# Patient Record
Sex: Male | Born: 1965 | Race: White | Hispanic: No | Marital: Married | Smoking: Never smoker
Health system: Southern US, Community
[De-identification: ages and names within clinical notes are randomized; demographics above are authoritative.]

## PROBLEM LIST (undated history)

## (undated) DIAGNOSIS — N200 Calculus of kidney: Secondary | ICD-10-CM

## (undated) DIAGNOSIS — K8020 Calculus of gallbladder without cholecystitis without obstruction: Secondary | ICD-10-CM

## (undated) HISTORY — PX: LITHOTRIPSY: SUR834

---

## 2012-03-24 ENCOUNTER — Emergency Department (HOSPITAL_BASED_OUTPATIENT_CLINIC_OR_DEPARTMENT_OTHER)
Admission: EM | Admit: 2012-03-24 | Discharge: 2012-03-24 | Disposition: A | Discharge status: Home / Self Care | Payer: Self-pay | Attending: Emergency Medicine | Admitting: Emergency Medicine

## 2012-03-24 ENCOUNTER — Encounter (HOSPITAL_BASED_OUTPATIENT_CLINIC_OR_DEPARTMENT_OTHER): Payer: Self-pay | Admitting: *Deleted

## 2012-03-24 ENCOUNTER — Emergency Department (HOSPITAL_BASED_OUTPATIENT_CLINIC_OR_DEPARTMENT_OTHER): Payer: Self-pay

## 2012-03-24 DIAGNOSIS — K8020 Calculus of gallbladder without cholecystitis without obstruction: Secondary | ICD-10-CM | POA: Insufficient documentation

## 2012-03-24 DIAGNOSIS — N200 Calculus of kidney: Secondary | ICD-10-CM | POA: Insufficient documentation

## 2012-03-24 DIAGNOSIS — R109 Unspecified abdominal pain: Secondary | ICD-10-CM | POA: Insufficient documentation

## 2012-03-24 DIAGNOSIS — K5730 Diverticulosis of large intestine without perforation or abscess without bleeding: Secondary | ICD-10-CM | POA: Insufficient documentation

## 2012-03-24 HISTORY — DX: Calculus of kidney: N20.0

## 2012-03-24 HISTORY — DX: Calculus of gallbladder without cholecystitis without obstruction: K80.20

## 2012-03-24 LAB — BASIC METABOLIC PANEL
BUN: 17 mg/dL (ref 6–23)
Calcium: 9.4 mg/dL (ref 8.4–10.5)
Creatinine, Ser: 1.1 mg/dL (ref 0.50–1.35)
GFR calc Af Amer: >90 mL/min (ref >90)
GFR calc non Af Amer: 78 mL/min — ABNORMAL LOW (ref >90)
Glucose, Bld: 107 mg/dL — ABNORMAL HIGH (ref 70–99)
Potassium: 3.9 mEq/L (ref 3.5–5.1)

## 2012-03-24 LAB — LIPASE, BLOOD: Lipase: 56 U/L (ref 11–59)

## 2012-03-24 LAB — URINALYSIS, ROUTINE W REFLEX MICROSCOPIC
Bilirubin Urine: NEGATIVE
Ketones, ur: NEGATIVE mg/dL
Nitrite: NEGATIVE
Protein, ur: NEGATIVE mg/dL
Urobilinogen, UA: 0.2 mg/dL (ref 0.0–1.0)

## 2012-03-24 LAB — CBC
Hemoglobin: 16.1 g/dL (ref 13.0–17.0)
MCH: 31 pg (ref 26.0–34.0)
MCHC: 35.6 g/dL (ref 30.0–36.0)
RDW: 11.5 % (ref 11.5–15.5)

## 2012-03-24 LAB — URINE MICROSCOPIC-ADD ON

## 2012-03-24 LAB — HEPATIC FUNCTION PANEL
Alkaline Phosphatase: 64 U/L (ref 39–117)
Indirect Bilirubin: 1.3 mg/dL — ABNORMAL HIGH (ref 0.3–0.9)
Total Bilirubin: 1.5 mg/dL — ABNORMAL HIGH (ref 0.3–1.2)
Total Protein: 7.4 g/dL (ref 6.0–8.3)

## 2012-03-24 MED ORDER — SODIUM CHLORIDE 0.9 % IV BOLUS (SEPSIS)
1000.0000 mL | Freq: Once | INTRAVENOUS | Status: AC
  Administered 2012-03-24: 1000 mL via INTRAVENOUS

## 2012-03-24 MED ORDER — TAMSULOSIN HCL 0.4 MG PO CAPS: 0.4000 mg | ORAL_CAPSULE | Freq: Every day | ORAL | Status: AC

## 2012-03-24 MED ORDER — OXYCODONE-ACETAMINOPHEN 5-325 MG PO TABS: 1.0000 | ORAL_TABLET | ORAL | Status: AC | PRN

## 2012-03-24 NOTE — ED Provider Notes (Signed)
History     CSN: [id number]  Arrival date & time 03/24/12  1739   First MD Initiated Contact with Patient 03/24/12 1758      Chief Complaint  Patient presents with  . Abdominal Pain  . Emesis    (Consider location/radiation/quality/duration/timing/severity/associated sxs/prior treatment) HPI CC: Abdominal pain  Abdominal pain: Acute onset, on Saturday night after a church cook out. No other patrons became ill. Started out as a crampy pain . Sunday cramps continued and pt became light headed. Last night pt dryheaved x1. Took laxatives thinking it may be related to constipation but had multiple BM w/o relief. Pain relieved w/ rest. Worsened by movement and standing/pressure on belly. PO normal. Denies recent sick contacts, recent travel or animal bites. Denies fever hematochezia, hematemesis, dysuria, frequency.   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   History reviewed. No pertinent past medical history.  History reviewed. No pertinent past surgical history.  History reviewed. No pertinent family history.  History  Substance Use Topics  . Smoking status: Not on file  . Smokeless tobacco: Not on file  . Alcohol Use: Not on file      Review of Systems Per hpi Allergies  Review of patient's allergies indicates no known allergies.  Home Medications   Current Outpatient Rx  Name Route Sig Dispense Refill  . OXYCODONE-ACETAMINOPHEN 5-325 MG PO TABS Oral Take 1 tablet by mouth every 4 (four) hours as needed for pain. 30 tablet 0  . TAMSULOSIN HCL 0.4 MG PO CAPS Oral Take 1 capsule (0.4 mg total) by mouth daily. 30 capsule 0    BP 115/72  Pulse 64  Temp 98.4 F (36.9 C) (Oral)  Resp 16  SpO2 98%  Physical Exam Gen: No distress lying in bed CV: RRR,  RES: CTAB, normal effort Abd: NABS, non-painful to palpation, no CVA tenderness.  ED Course  Procedures (including critical  care time)  Labs Reviewed  URINALYSIS, ROUTINE W REFLEX MICROSCOPIC - Abnormal; Notable for the following:    APPearance CLOUDY (*)     Hgb urine dipstick LARGE (*)     All other components within normal limits  BASIC METABOLIC PANEL - Abnormal; Notable for the following:    Glucose, Bld 107 (*)     GFR calc non Af Amer 78 (*)     All other components within normal limits  HEPATIC FUNCTION PANEL - Abnormal; Notable for the following:    Total Bilirubin 1.5 (*)     Indirect Bilirubin 1.3 (*)     All other components within normal limits  URINE MICROSCOPIC-ADD ON - Abnormal; Notable for the following:    Bacteria, UA FEW (*)     All other components within normal limits  CBC  LIPASE, BLOOD   Ct Abdomen Pelvis Wo Contrast  03/24/2012  *RADIOLOGY REPORT*  Clinical Data: Abdominal pain.  Question kidney stone.  Nausea.  CT ABDOMEN AND PELVIS WITHOUT CONTRAST  Technique:  Multidetector CT imaging of the abdomen and pelvis was performed following the standard protocol without intravenous contrast.  Comparison: Abdominal films same date.  No comparison CT.  Findings: Within the left kidney there are at least two nonobstructing renal calculi largest located within the lower pole region measuring 6 mm.  There is minimal fullness of the left renal collecting system and ureter.  Within the distal left ureter there may be a  tiny stone but difficult to confirm with any certainty. If this represents a true tiny stone, it is located 5 cm proximal to the ureteral vesicle junction.  Elevated left hemidiaphragm with left base atelectasis/scarring.  Small sclerotic focus ilium bilaterally may represent bone islands. No bony destructive lesion.  6 mm gallstone gallbladder neck region.  No pericholecystic fluid identified.  If primary gallbladder abnormality is of concern ultrasound may then be considered.  Evaluation of solid abdominal viscera is limited by lack of IV contrast.  Taking this limitation into account  no worrisome focal hepatic, splenic, pancreatic, renal or adrenal lesion.  No abdominal aortic aneurysm.  Sigmoid diverticula without findings of inflammation.  Under distended portions of bowel including the stomach without extraluminal bowel inflammatory process, free fluid or free air. Specifically, no inflammation of the appendix or terminal ileum.  IMPRESSION: Within the left kidney there are at least two nonobstructing renal calculi largest located within the lower pole region measuring 6 mm.  There is minimal fullness of the left renal collecting system and ureter.  Within the distal left ureter there may be a tiny stone but difficult to confirm with any certainty.  If this represents a true tiny stone, it is located 5 cm proximal to the ureteral vesicle junction.  6 mm gallstone gallbladder neck region.  No pericholecystic fluid identified.  If primary gallbladder abnormality is of concern ultrasound may then be considered.  Sigmoid diverticulosis.   Original Report Authenticated By: Fuller Canada, M.D.    Dg Abd 1 View  03/24/2012  *RADIOLOGY REPORT*  Clinical Data: Abdominal pain.  Nausea.  ABDOMEN - 1 VIEW  Comparison: None.  Findings: Bowel gas pattern is normal without evidence of ileus, obstruction or free air.  No worrisome calcifications.  There are some phleboliths in the pelvis.  The bony structures are unremarkable.  IMPRESSION: Radiographs within normal limits.   Original Report Authenticated By: Thomasenia Sales, M.D.      1. Cholelithiasis without obstruction   2. Nephrolithiasis       MDM  46yo m w/ abdominal pain secondary to nephrolithiasis and current passing of stone in L ureter. Of note pt also with cholelitiasis - Flomax - Percocet - handouts given - Pt to f/u w/ [facility] -           Ozella Rocks, MD 03/24/12 2212

## 2012-03-24 NOTE — ED Notes (Signed)
Pt amb to triage with quick steady gait in nad. Pt states he has been feeling "queasy and cramping" since eating at a church pot luck on Saturday, with vomiting and dry heaves. Denies any fevers, took laxative last night with some results. Denies any abd pain at this time.

## 2012-03-24 NOTE — ED Notes (Signed)
MD at bedside giving test results and plan of care for dispo. 

## 2012-03-25 NOTE — ED Provider Notes (Signed)
Medical screening examination/treatment/procedure(s) were performed by non-physician practitioner and as supervising physician I was immediately available for consultation/collaboration.    Nelia Shi, MD 03/25/12 [phone]

## 2013-09-19 IMAGING — CT CT ABD-PELV W/O CM
2 of 4 series · 15 of 46 positions shown, 17 images · non-contrast
Comparison: Abdominal films same date.  No comparison CT.

CLINICAL DATA: Abdominal pain.  Question kidney stone.  Nausea.

CT ABDOMEN AND PELVIS WITHOUT CONTRAST
TECHNIQUE: Multidetector CT imaging of the abdomen and pelvis was
performed following the standard protocol without intravenous
contrast.

[Series 2: renal stone < 200 lbs 5.0 b31f · axial · 0.78mm/px · z∈[-748,-283]mm · 12 of 103 slices shown, 14 images]
[im 5/103  soft-tissue]
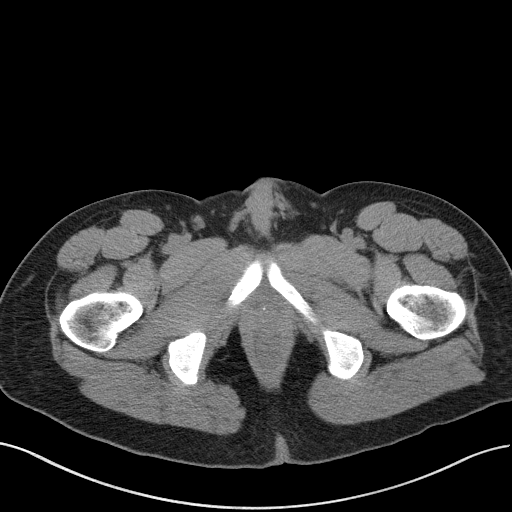
[im 5/103  bone]
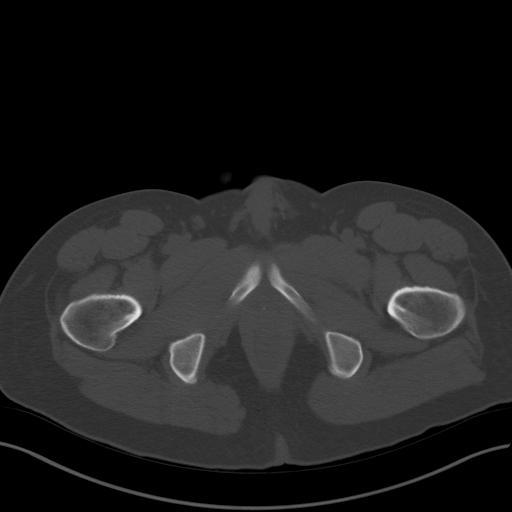
[im 14/103  soft-tissue]
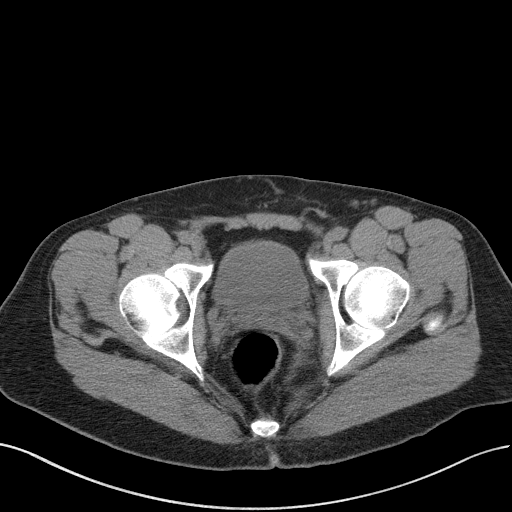
[im 23/103  soft-tissue]
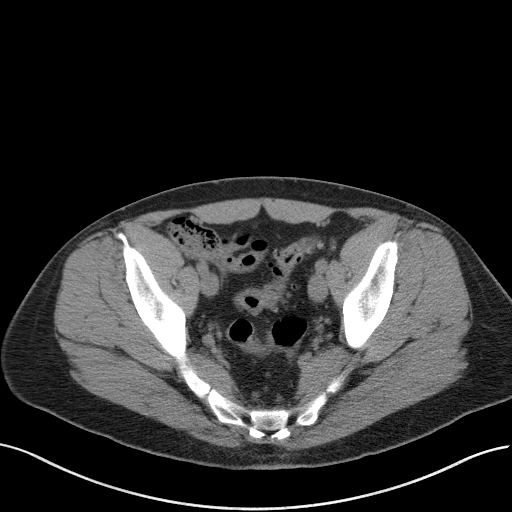
[im 32/103  soft-tissue]
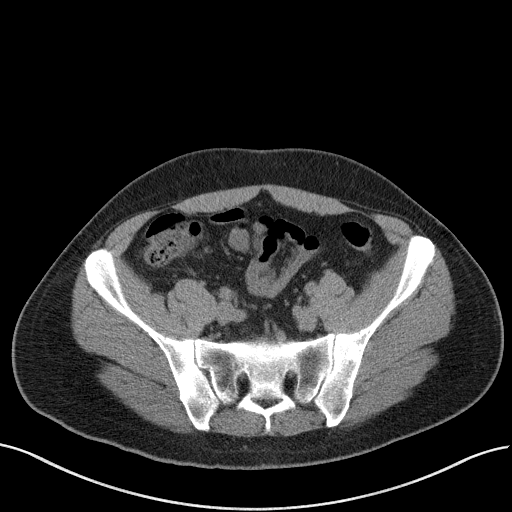
[im 40/103  soft-tissue]
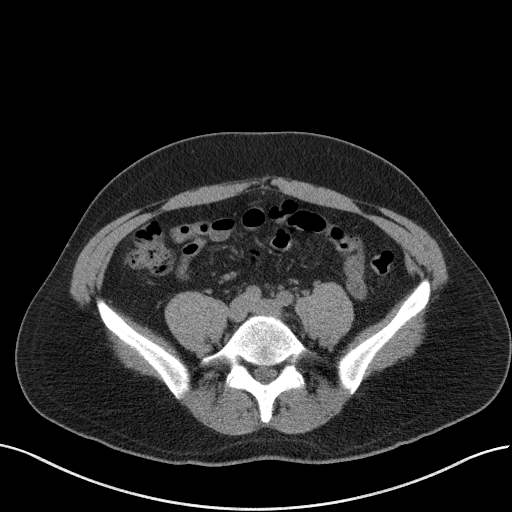
[im 49/103  soft-tissue]
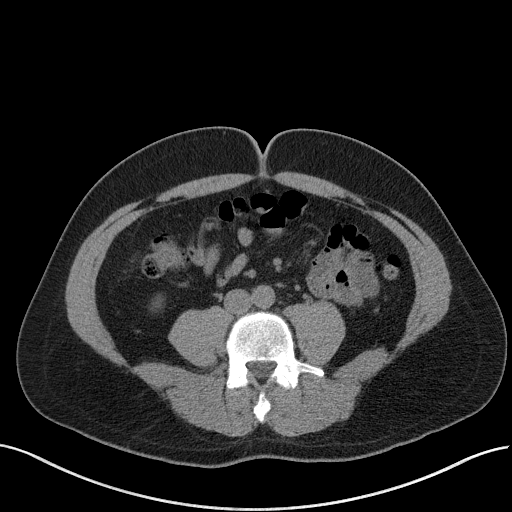
[im 54/103  soft-tissue]
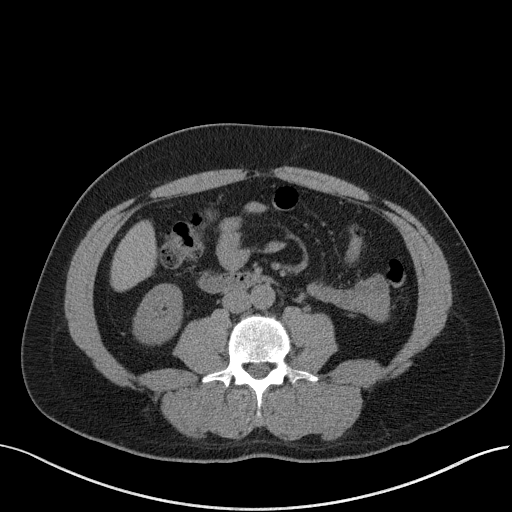
[im 63/103  soft-tissue]
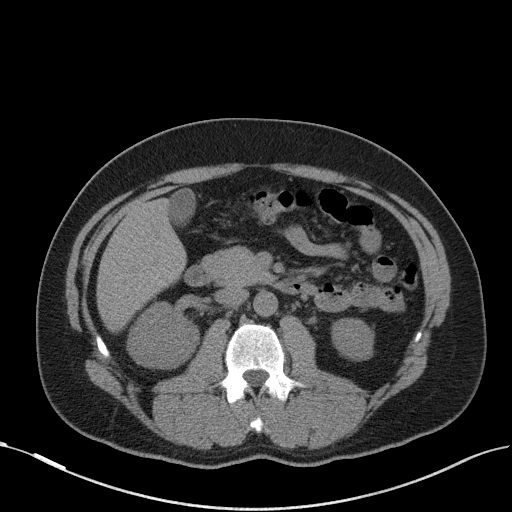
[im 71/103  soft-tissue]
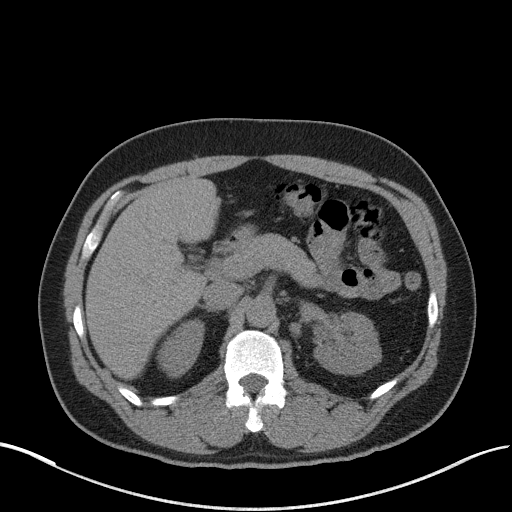
[im 71/103  bone]
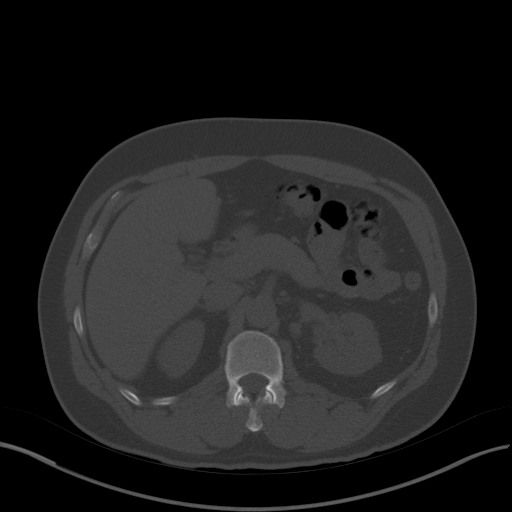
[im 80/103  soft-tissue]
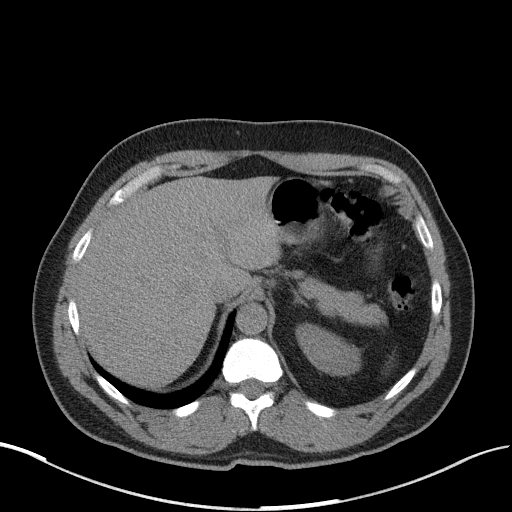
[im 89/103  soft-tissue]
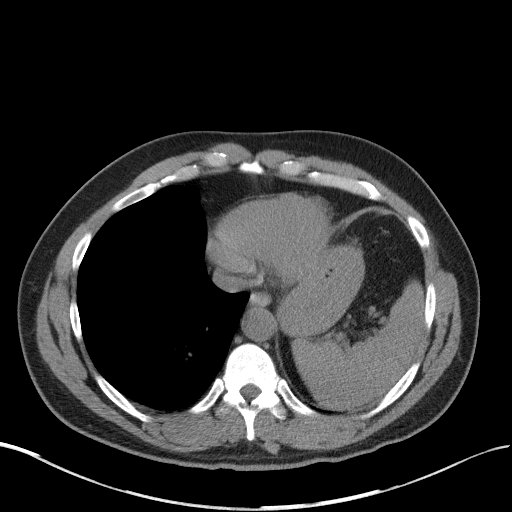
[im 98/103  soft-tissue]
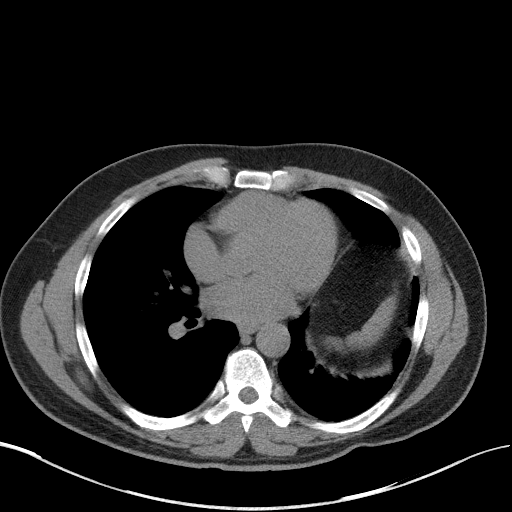

[Series 5: renal stone 3.0 coronal · coronal · 0.70mm/px · 3 of 90 slices shown]
[im 30/90  soft-tissue]
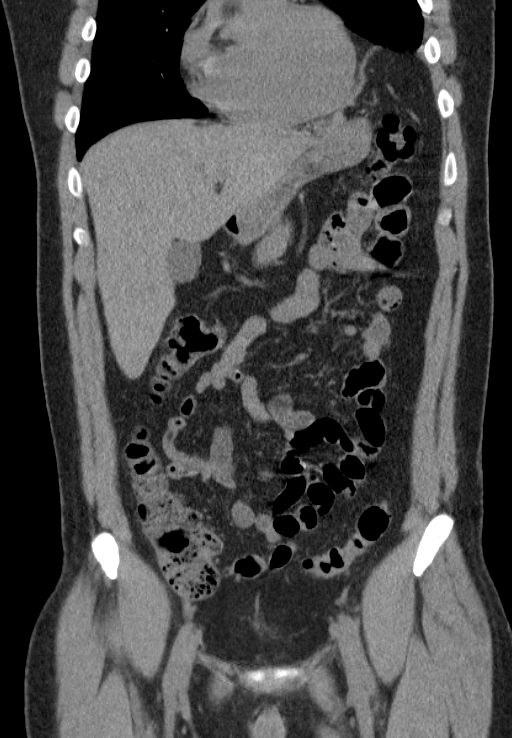
[im 40/90  soft-tissue]
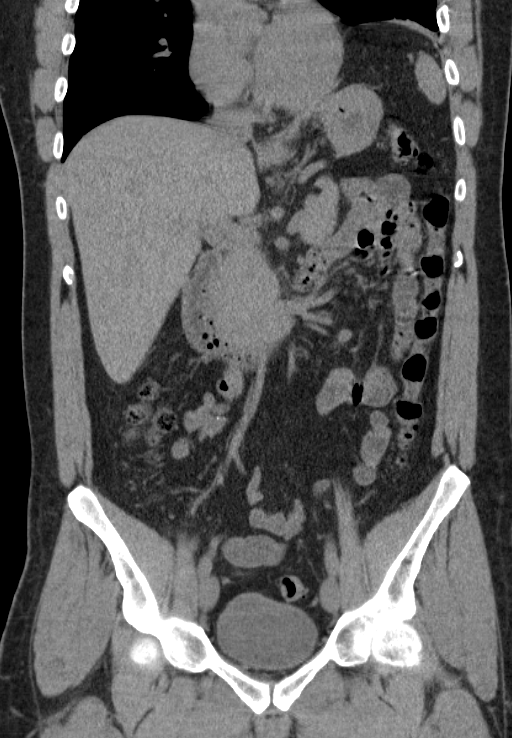
[im 50/90  soft-tissue]
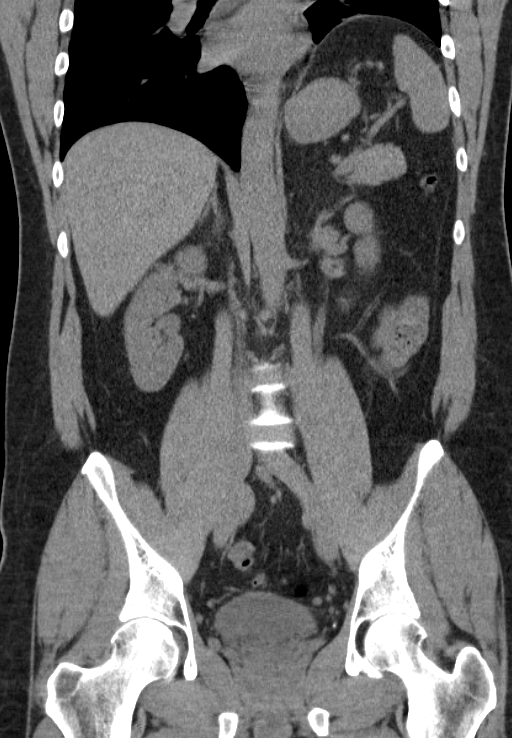

[15 of 46 positions shown; findings below may reference images not displayed]

FINDINGS: Within the left kidney there are at least two
nonobstructing renal calculi largest located within the lower pole
region measuring 6 mm.  There is minimal fullness of the left renal
collecting system and ureter.  Within the distal left ureter there
may be a tiny stone but difficult to confirm with any certainty.
If this represents a true tiny stone, it is located 5 cm proximal
to the ureteral vesicle junction.

Elevated left hemidiaphragm with left base atelectasis/scarring.

Small sclerotic focus ilium bilaterally may represent bone islands.
No bony destructive lesion.

6 mm gallstone gallbladder neck region.  No pericholecystic fluid
identified.  If primary gallbladder abnormality is of concern
ultrasound may then be considered.

Evaluation of solid abdominal viscera is limited by lack of IV
contrast.  Taking this limitation into account no worrisome focal
hepatic, splenic, pancreatic, renal or adrenal lesion.

No abdominal aortic aneurysm.

Sigmoid diverticula without findings of inflammation.  Under
distended portions of bowel including the stomach without
extraluminal bowel inflammatory process, free fluid or free air.
Specifically, no inflammation of the appendix or terminal ileum.
IMPRESSION: Within the left kidney there are at least two nonobstructing renal
calculi largest located within the lower pole region measuring 6
mm.  There is minimal fullness of the left renal collecting system
and ureter.  Within the distal left ureter there may be a tiny
stone but difficult to confirm with any certainty.  If this
represents a true tiny stone, it is located 5 cm proximal to the
ureteral vesicle junction.

6 mm gallstone gallbladder neck region.  No pericholecystic fluid
identified.  If primary gallbladder abnormality is of concern
ultrasound may then be considered.

Sigmoid diverticulosis.

## 2019-02-15 ENCOUNTER — Other Ambulatory Visit: Payer: Self-pay

## 2019-02-15 ENCOUNTER — Emergency Department (HOSPITAL_BASED_OUTPATIENT_CLINIC_OR_DEPARTMENT_OTHER)
Admission: EM | Admit: 2019-02-15 | Discharge: 2019-02-15 | Disposition: A | Discharge status: Home / Self Care | Payer: Self-pay | Attending: Emergency Medicine | Admitting: Emergency Medicine

## 2019-02-15 ENCOUNTER — Encounter (HOSPITAL_BASED_OUTPATIENT_CLINIC_OR_DEPARTMENT_OTHER): Payer: Self-pay | Admitting: *Deleted

## 2019-02-15 DIAGNOSIS — Y9389 Activity, other specified: Secondary | ICD-10-CM | POA: Insufficient documentation

## 2019-02-15 DIAGNOSIS — Y929 Unspecified place or not applicable: Secondary | ICD-10-CM | POA: Insufficient documentation

## 2019-02-15 DIAGNOSIS — R509 Fever, unspecified: Secondary | ICD-10-CM | POA: Insufficient documentation

## 2019-02-15 DIAGNOSIS — W5501XA Bitten by cat, initial encounter: Secondary | ICD-10-CM | POA: Insufficient documentation

## 2019-02-15 DIAGNOSIS — S61451A Open bite of right hand, initial encounter: Secondary | ICD-10-CM | POA: Insufficient documentation

## 2019-02-15 DIAGNOSIS — S50812A Abrasion of left forearm, initial encounter: Secondary | ICD-10-CM | POA: Insufficient documentation

## 2019-02-15 DIAGNOSIS — Z20828 Contact with and (suspected) exposure to other viral communicable diseases: Secondary | ICD-10-CM | POA: Insufficient documentation

## 2019-02-15 DIAGNOSIS — Z79899 Other long term (current) drug therapy: Secondary | ICD-10-CM | POA: Insufficient documentation

## 2019-02-15 DIAGNOSIS — S50811A Abrasion of right forearm, initial encounter: Secondary | ICD-10-CM | POA: Insufficient documentation

## 2019-02-15 DIAGNOSIS — S61233A Puncture wound without foreign body of left middle finger without damage to nail, initial encounter: Secondary | ICD-10-CM | POA: Insufficient documentation

## 2019-02-15 DIAGNOSIS — Y999 Unspecified external cause status: Secondary | ICD-10-CM | POA: Insufficient documentation

## 2019-02-15 MED ORDER — AMOXICILLIN-POT CLAVULANATE 875-125 MG PO TABS
1.0000 | ORAL_TABLET | Freq: Once | ORAL | Status: AC
  Administered 2019-02-15: 1 via ORAL
  Filled 2019-02-15: qty 1

## 2019-02-15 MED ORDER — AMOXICILLIN-POT CLAVULANATE 875-125 MG PO TABS: 1.0000 | ORAL_TABLET | Freq: Two times a day (BID) | ORAL | 0 refills | Status: AC

## 2019-02-15 MED ORDER — ACETAMINOPHEN 500 MG PO TABS
1000.0000 mg | ORAL_TABLET | Freq: Once | ORAL | Status: AC
  Administered 2019-02-15: 1000 mg via ORAL
  Filled 2019-02-15: qty 2

## 2019-02-15 NOTE — ED Notes (Signed)
Pt sts he felt normal and worked his full day until about 3 or 4pm; started with chills and had a fever at home; denies pain other than cat bite/scratches; denies cough or [facility]

## 2019-02-15 NOTE — Discharge Instructions (Signed)
Take tylenol 2 pills 4 times a day and motrin 4 pills 3 times a day.  Drink plenty of fluids.  Return for worsening shortness of breath, headache, confusion. Follow up with your family doctor.   

## 2019-02-15 NOTE — ED Triage Notes (Signed)
Pt c/o fever after cat bite this am, c/o body aches and chills

## 2019-02-15 NOTE — ED Provider Notes (Signed)
[facility] EMERGENCY DEPARTMENT Provider Note   CSN: [id number] Arrival date & time: 02/15/19  1831     History   Chief Complaint Chief Complaint  Patient presents with  . Fever    HPI Michael Flynn is a 53 y.o. male.     53 yo M with a chief complaint of a fever.  The patient tried to take his cat to get neater today and he was attacked by it.  Was scratched on both arms and bit to the right second digit.  He rinsed these wounds out well for at least 5 minutes at each location.  He went to work and then when he was going home he noticed that he felt feverish.  He checked his temperature and found out that his temp was 100.5 and then came here and it had gone up.  He denies cough or congestion denies nausea vomiting or diarrhea denies abdominal pain.  Denies recent tick bite.  Denies recent rash.  No prior cat bite.  The history is provided by the patient.  Fever Associated symptoms: no chest pain, no chills, no confusion, no congestion, no diarrhea, no headaches, no myalgias, no rash and no vomiting   Illness Severity:  Moderate Onset quality:  Gradual Duration:  2 hours Timing:  Constant Progression:  Unchanged Chronicity:  New Associated symptoms: fever   Associated symptoms: no abdominal pain, no chest pain, no congestion, no diarrhea, no headaches, no myalgias, no rash, no shortness of breath and no vomiting     Past Medical History:  Diagnosis Date  . Cholelithiasis   . Nephrolithiasis     There are no active problems to display for this patient.   Past Surgical History:  Procedure Laterality Date  . LITHOTRIPSY          Home Medications    Prior to Admission medications   Medication Sig Start Date End Date Taking? Authorizing Provider  amoxicillin-clavulanate (AUGMENTIN) 875-125 MG tablet Take 1 tablet by mouth every 12 (twelve) hours. 02/15/19   Melene PlanFloyd, Jalyiah Shelley, DO  oxyCODONE-acetaminophen (ROXICET) 5-325 MG per tablet Take 1 tablet by mouth every  4 (four) hours as needed for pain. 03/24/12   Ozella RocksMerrell, David J, MD  Tamsulosin HCl (FLOMAX) 0.4 MG CAPS Take 1 capsule (0.4 mg total) by mouth daily. 03/24/12   Ozella RocksMerrell, David J, MD    Family History No family history on file.  Social History Social History   Tobacco Use  . Smoking status: Never Smoker  . Smokeless tobacco: Never Used  Substance Use Topics  . Alcohol use: Not Currently  . Drug use: Not Currently     Allergies   Patient has no known allergies.   Review of Systems Review of Systems  Constitutional: Positive for fever. Negative for chills.  HENT: Negative for congestion and facial swelling.   Eyes: Negative for discharge and visual disturbance.  Respiratory: Negative for shortness of breath.   Cardiovascular: Negative for chest pain and palpitations.  Gastrointestinal: Negative for abdominal pain, diarrhea and vomiting.  Musculoskeletal: Negative for arthralgias and myalgias.  Skin: Positive for wound. Negative for color change and rash.  Neurological: Negative for tremors, syncope and headaches.  Psychiatric/Behavioral: Negative for confusion and dysphoric mood.     Physical Exam Updated Vital Signs BP 121/63   Pulse (!) 105   Temp (!) 101 F (38.3 C) (Oral)   Resp 18   Ht 5\' 9"  (1.753 m)   Wt 93 kg   SpO2 97%  BMI 30.27 kg/m   Physical Exam Vitals signs and nursing note reviewed.  Constitutional:      Appearance: He is well-developed.  HENT:     Head: Normocephalic and atraumatic.     Right Ear: Tympanic membrane normal.     Left Ear: Tympanic membrane normal.  Eyes:     Pupils: Pupils are equal, round, and reactive to light.  Neck:     Musculoskeletal: Normal range of motion and neck supple.     Vascular: No JVD.  Cardiovascular:     Rate and Rhythm: Normal rate and regular rhythm.     Heart sounds: No murmur. No friction rub. No gallop.   Pulmonary:     Effort: No respiratory distress.     Breath sounds: No wheezing.  Abdominal:      General: There is no distension.     Tenderness: There is no abdominal tenderness. There is no guarding or rebound.  Musculoskeletal: Normal range of motion.     Comments: Gaping laceration to the right dorsal aspect of the hand.  Approximately 2 cm in length.  Multiple superficial scratches along bilateral forearms and puncture marks to the left second digit at the tip.  No significant erythema.  No significant warmth.  Skin:    Coloration: Skin is not pale.     Findings: No rash.  Neurological:     Mental Status: He is alert and oriented to person, place, and time.  Psychiatric:        Behavior: Behavior normal.      ED Treatments / Results  Labs (all labs ordered are listed, but only abnormal results are displayed) Labs Reviewed  NOVEL CORONAVIRUS, NAA (HOSP ORDER, SEND-OUT TO REF LAB; TAT 18-24 HRS)    EKG None  Radiology No results found.  Procedures Procedures (including critical care time)  Medications Ordered in ED Medications  acetaminophen (TYLENOL) tablet 1,000 mg (1,000 mg Oral Given 02/15/19 1853)  amoxicillin-clavulanate (AUGMENTIN) 875-125 MG per tablet 1 tablet (1 tablet Oral Given 02/15/19 2111)     Initial Impression / Assessment and Plan / ED Course  I have reviewed the triage vital signs and the nursing notes.  Pertinent labs & imaging results that were available during my care of the patient were reviewed by me and considered in my medical decision making (see chart for details).        53 yo M with a chief complaints of a cat bite and a fever.  These do not seem to be related.  Timeline wise it would be odd to have a fever so soon after being bit by his cat.  More likely he has a different source of his infection.  However he is only had 2 hours of fever and has no other symptoms and so it would be difficult to identify the exact source at this time.  He is not complaining of any shortness of breath has clear lung sounds.  Has no abdominal  tenderness no urinary symptoms.  I feel a UA lab work and a chest x-ray would be low yield.  I discussed this with the patient and he agrees.  I will start him on prophylactic Augmentin for his cat bites.  We will have him follow-up with his family doctor.  As this is occurring in the timeline of the novel coronavirus I will test him as an outpatient.  Ajani Haymes was evaluated in Emergency Department on 02/15/2019 for the symptoms described in the history of present  illness. He/she was evaluated in the context of the global COVID-19 pandemic, which necessitated consideration that the patient might be at risk for infection with the SARS-CoV-2 virus that causes COVID-19. Institutional protocols and algorithms that pertain to the evaluation of patients at risk for COVID-19 are in a state of rapid change based on information released by regulatory bodies including the CDC and federal and state organizations. These policies and algorithms were followed during the patient's care in the ED.  9:28 PM:  I have discussed the diagnosis/risks/treatment options with the patient and family and believe the pt to be eligible for discharge home to follow-up with PCP. We also discussed returning to the ED immediately if new or worsening sx occur. We discussed the sx which are most concerning (e.g., sudden worsening pain, fever, inability to tolerate by mouth, sob) that necessitate immediate return. Medications administered to the patient during their visit and any new prescriptions provided to the patient are listed below.  Medications given during this visit Medications  acetaminophen (TYLENOL) tablet 1,000 mg (1,000 mg Oral Given 02/15/19 1853)  amoxicillin-clavulanate (AUGMENTIN) 875-125 MG per tablet 1 tablet (1 tablet Oral Given 02/15/19 2111)     The patient appears reasonably screen and/or stabilized for discharge and I doubt any other medical condition or other [facility] requiring further screening, evaluation, or  treatment in the ED at this time prior to discharge.    Final Clinical Impressions(s) / ED Diagnoses   Final diagnoses:  Cat bite of right hand, initial encounter    ED Discharge Orders         Ordered    amoxicillin-clavulanate (AUGMENTIN) 875-125 MG tablet  Every 12 hours     02/15/19 2107           Deno Etienne, DO 02/15/19 2128

## 2019-02-17 LAB — NOVEL CORONAVIRUS, NAA (HOSP ORDER, SEND-OUT TO REF LAB; TAT 18-24 HRS): SARS-CoV-2, NAA: NOT DETECTED
